# Patient Record
Sex: Female | Born: 1990 | Race: White | Hispanic: No | Marital: Single | Smoking: Never smoker
Health system: Southern US, Community
[De-identification: ages and names within clinical notes are randomized; demographics above are authoritative.]

## PROBLEM LIST (undated history)

## (undated) DIAGNOSIS — J45909 Unspecified asthma, uncomplicated: Secondary | ICD-10-CM

---
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
## 2008-05-06 ENCOUNTER — Ambulatory Visit (HOSPITAL_BASED_OUTPATIENT_CLINIC_OR_DEPARTMENT_OTHER): Admission: RE | Admit: 2008-05-06 | Discharge: 2008-05-06 | Payer: Self-pay | Admitting: Otolaryngology

## 2008-05-06 ENCOUNTER — Encounter (INDEPENDENT_AMBULATORY_CARE_PROVIDER_SITE_OTHER): Payer: Self-pay | Admitting: Otolaryngology

## 2008-05-09 ENCOUNTER — Emergency Department (HOSPITAL_COMMUNITY): Admission: EM | Admit: 2008-05-09 | Discharge: 2008-05-10 | Payer: Self-pay | Admitting: Emergency Medicine

## 2009-06-26 IMAGING — CR DG ABDOMEN ACUTE W/ 1V CHEST
3 series · 3 of 3 positions shown · non-contrast
Comparison: None.

CLINICAL DATA: Vomiting.  Dehydration.  Status post tonsillectomy
on 05/06/2008.

ACUTE ABDOMEN SERIES (ABDOMEN 2 VIEW & CHEST 1 VIEW)

[w chest pa]
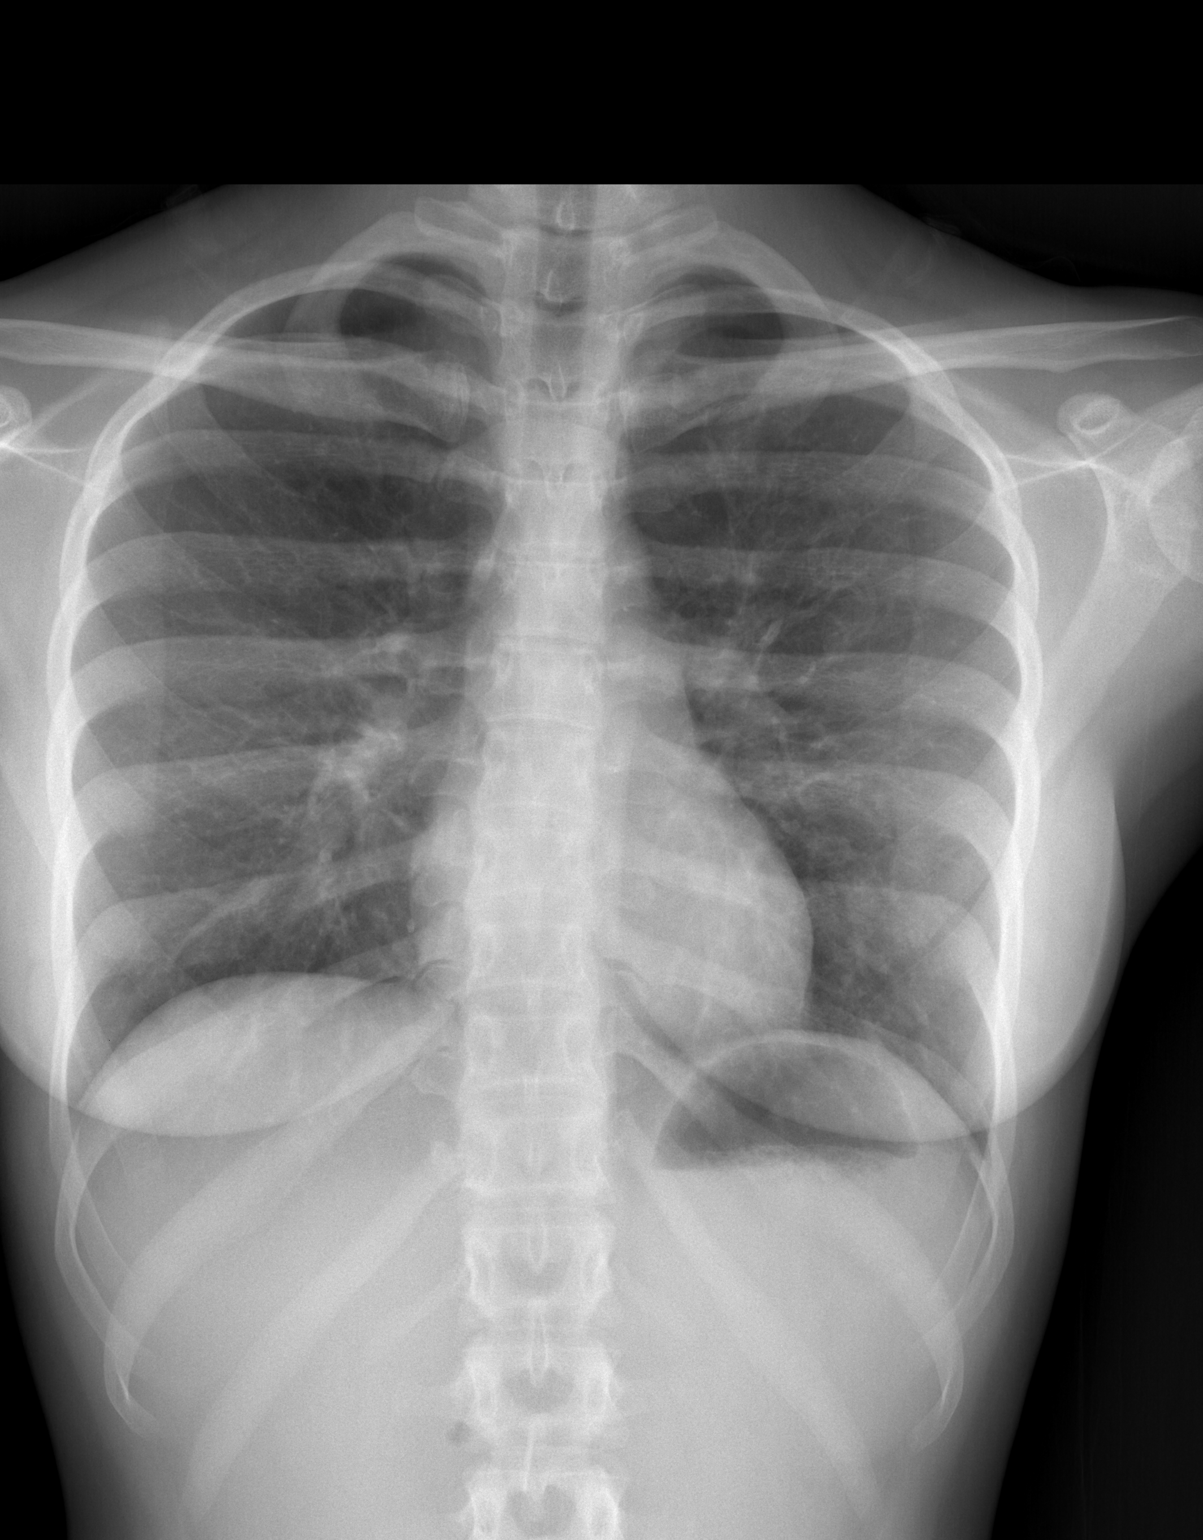

[w abdomen upright *]
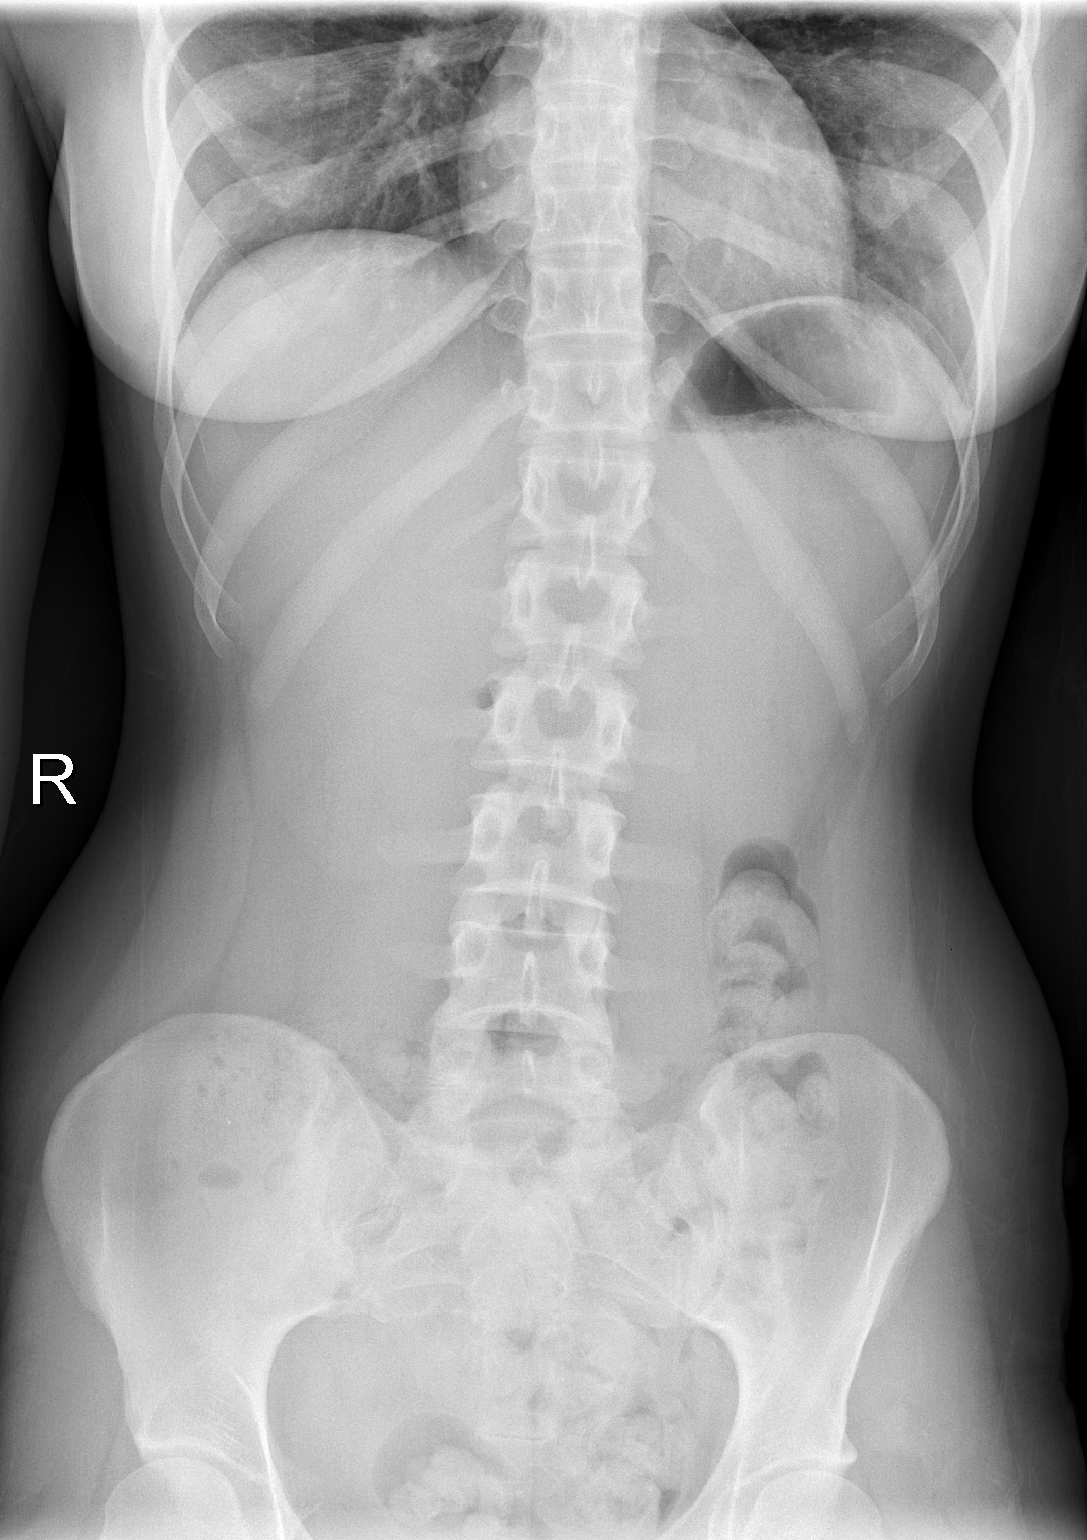

[t abdomen supine]
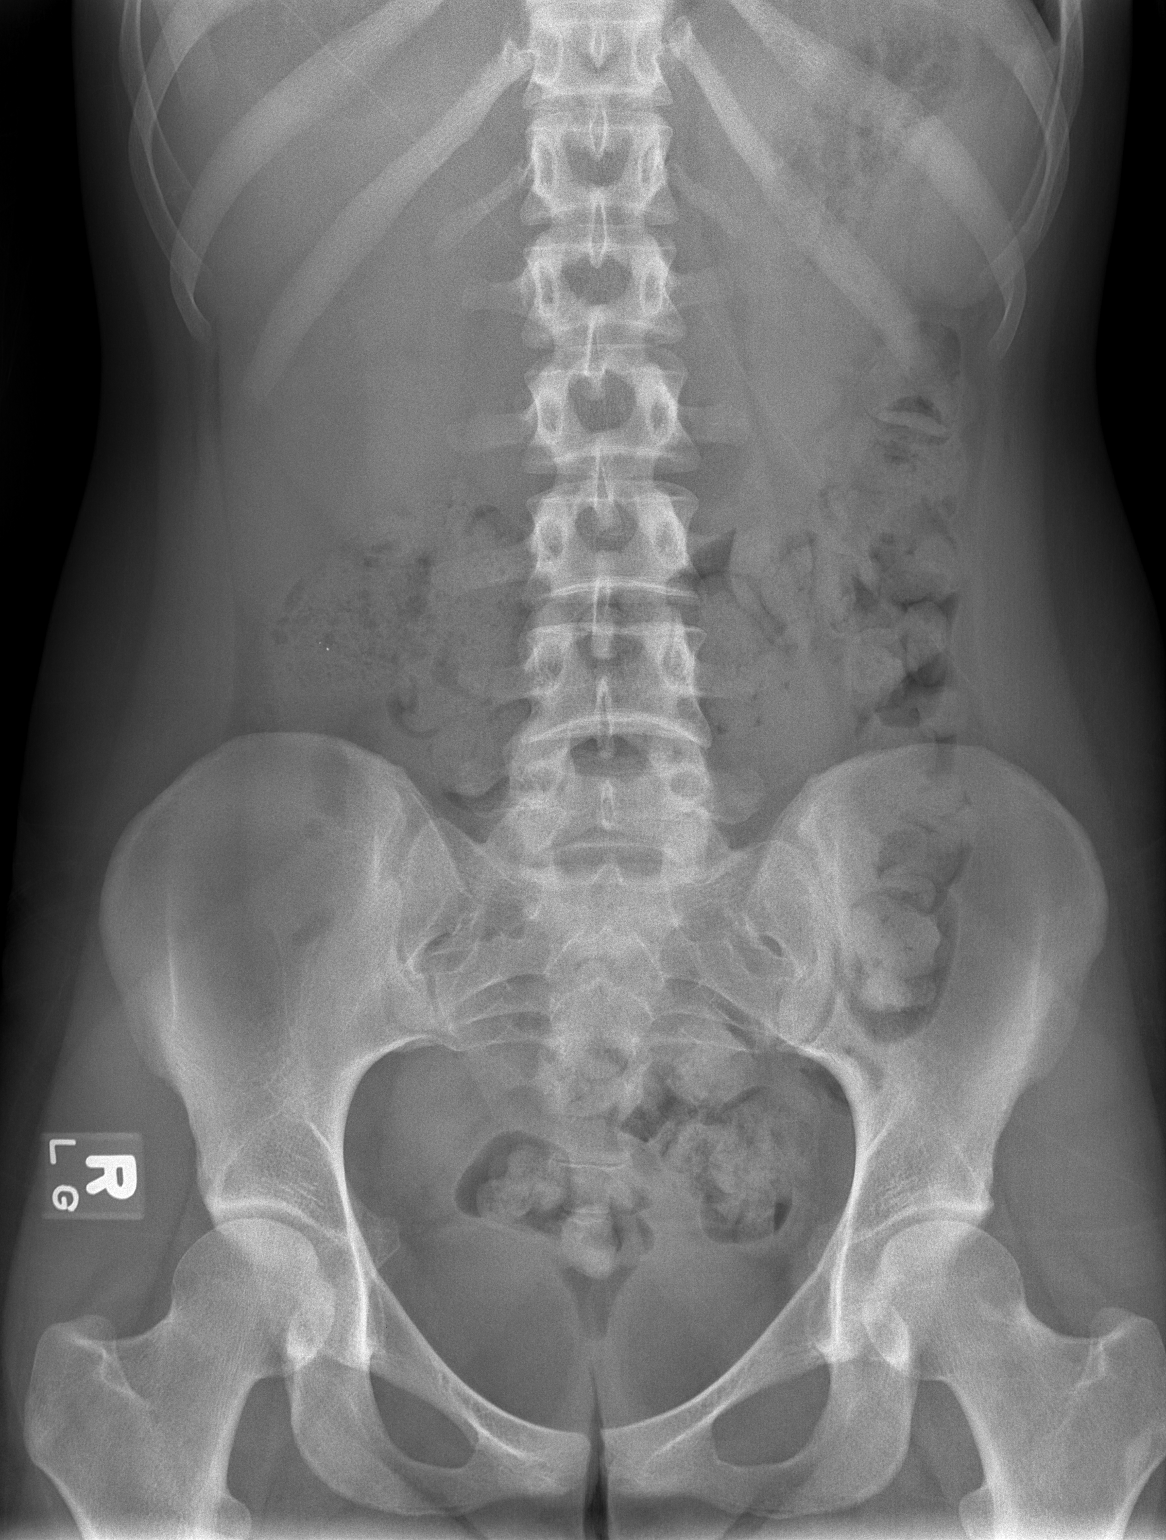

[3 of 3 positions shown; findings below may reference images not displayed]

FINDINGS: Normal sized heart.  Clear lungs.  Normal bowel gas
pattern without free peritoneal air.  Stool throughout the colon.
Mild scoliosis on the upright view.
IMPRESSION: Stool throughout the colon.  No acute abnormality.

## 2010-10-30 NOTE — Op Note (Signed)
NAME:  Sonya Parker, Sonya Parker              ACCOUNT NO.:  [id number]   MEDICAL RECORD NO.:  [id number]          PATIENT TYPE:  AMB   LOCATION:  DSC                          FACILITY:  [facility]   PHYSICIAN:  Christopher E. Ezzard Standing, M.D.DATE OF BIRTH:  Jan 01, 1991   DATE OF PROCEDURE:  05/06/2008  DATE OF DISCHARGE:                               OPERATIVE REPORT   PREOPERATIVE DIAGNOSIS:  Recurrent tonsillitis.   POSTOPERATIVE DIAGNOSIS:  Recurrent tonsillitis.   OPERATION PERFORMED:  Tonsillectomy.   SURGEON:  Kristine Garbe. Ezzard Standing, MD   ANESTHESIA:  General endotracheal.   COMPLICATIONS:  None.   CLINICAL NOTE:  Sonya Parker is an 20 year old female who has had  history of recurrent tonsil problems with recurrent sore throats and  swollen tonsils.  This occurs several times every year.  Because of  history of recurrent tonsillitis, she is taken to the operating room at  this time for tonsillectomy.   DESCRIPTION OF PROCEDURE:  After adequate endotracheal anesthesia, the  patient received 1 g Ancef IV preoperatively as well as 10 mg of  Decadron.  A mouth gag was used to expose the oropharynx.  The left and  right tonsils were then resected from tonsillar fossa using the cautery.  Care was taken to preserve the anterior and posterior tonsillar pillars  as well as uvula.  Hemostasis was obtained with cautery.  Oropharynx was  then irrigated with saline.  This completed procedure.  Sonya Parker was  awoken from anesthesia and transferred to recovery room, postop doing  well.   DISPOSITION:  Sonya Parker is discharged home later this morning on  Zithromax suspension 200 mg daily for 6 days and Tylenol Lortab elixir  one to one-half tablespoons q.4 h p.r.n. pain.  Further followup in my  office in 2 weeks for recheck.           ______________________________  Kristine Garbe. Ezzard Standing, M.D.     CEN/MEDQ  D:  05/06/2008  T:  05/06/2008  Job:  [id number]

## 2011-03-19 LAB — POCT HEMOGLOBIN-HEMACUE: Hemoglobin: 13.4

## 2018-11-26 ENCOUNTER — Other Ambulatory Visit: Payer: Self-pay

## 2018-11-26 ENCOUNTER — Encounter (HOSPITAL_COMMUNITY): Payer: Self-pay | Admitting: Family Medicine

## 2018-11-26 ENCOUNTER — Ambulatory Visit (HOSPITAL_COMMUNITY)
Admission: EM | Admit: 2018-11-26 | Discharge: 2018-11-26 | Disposition: A | Discharge status: Home / Self Care | Payer: Managed Care, Other (non HMO) | Attending: Family Medicine | Admitting: Family Medicine

## 2018-11-26 DIAGNOSIS — R198 Other specified symptoms and signs involving the digestive system and abdomen: Secondary | ICD-10-CM | POA: Diagnosis not present

## 2018-11-26 DIAGNOSIS — K1379 Other lesions of oral mucosa: Secondary | ICD-10-CM

## 2018-11-26 HISTORY — DX: Unspecified asthma, uncomplicated: J45.909

## 2018-11-26 MED ORDER — PREDNISONE 10 MG (21) PO TBPK: ORAL_TABLET | Freq: Every day | ORAL | 0 refills | Status: AC

## 2018-11-26 NOTE — ED Triage Notes (Signed)
Pt states she has sores that come up in the inside of her mouth that comes and goes x 1 week. This has happened 3 times in one week.

## 2018-12-04 NOTE — ED Provider Notes (Signed)
[facility]   [id number] 11/26/18 Arrival Time: 7106  ASSESSMENT & PLAN:  1. Mucosal irritation of oral cavity    No sign of abscess requiring I&D at this time. Discussed. Reassured this does not look like herpes labialis. Question irritative vs early mucocele. Trial of:  Meds ordered this encounter  Medications  . predniSONE (STERAPRED UNI-PAK 21 TAB) 10 MG (21) TBPK tablet    Sig: Take by mouth daily. Take as directed.    Dispense:  21 tablet    Refill:  0    She may schedule dental evaluation as soon as possible if not improving over the next 24-48 hours.  Reviewed expectations re: course of current medical issues. Questions answered. Outlined signs and symptoms indicating need for more acute intervention. Patient verbalized understanding. After Visit Summary given.   SUBJECTIVE:  Sonya Parker is a 28 y.o. female who reports concern over "a few little small areas" over upper lip/inside. On/off over the past week or two. No pain. Does not limit eating/swallowing. No self tx. No specific aggravating or alleviating factors reported. No h/o similar. Worry voiced over possibility of these being herpes. No bleeding or drainage. No injury to lips.  ROS: As per HPI.  OBJECTIVE: Vitals:   11/26/18 1824 11/26/18 1827  BP:  123/80  Pulse:  93  Resp:  18  Temp:  98.6 F (37 C)  TempSrc:  Oral  SpO2:  100%  Weight: 47.2 kg     General appearance: alert; no distress HENT: normocephalic; atraumatic; dentition: good; upper and lower gums without areas of fluctuance, drainage, or bleeding and without tenderness to palpation; normal jaw movement without difficulty; very slight irritated area of upper inner L lip without ulcerations or TTP Neck: supple without LAD; FROM; trachea midline Lungs: normal respirations; unlabored Skin: warm and dry Psychological: alert and cooperative; normal mood and affect  Allergies  Allergen Reactions  . Penicillins     Past  Medical History:  Diagnosis Date  . Asthma    Social History   Socioeconomic History  . Marital status: Single    Spouse name: Not on file  . Number of children: Not on file  . Years of education: Not on file  . Highest education level: Not on file  Occupational History  . Not on file  Social Needs  . Financial resource strain: Not on file  . Food insecurity    Worry: Not on file    Inability: Not on file  . Transportation needs    Medical: Not on file    Non-medical: Not on file  Tobacco Use  . Smoking status: Never Smoker  . Smokeless tobacco: Never Used  Substance and Sexual Activity  . Alcohol use: Yes  . Drug use: Never  . Sexual activity: Yes  Lifestyle  . Physical activity    Days per week: Not on file    Minutes per session: Not on file  . Stress: Not on file  Relationships  . Social Herbalist on phone: Not on file    Gets together: Not on file    Attends religious service: Not on file    Active member of club or organization: Not on file    Attends meetings of clubs or organizations: Not on file    Relationship status: Not on file  . Intimate partner violence    Fear of current or ex partner: Not on file    Emotionally abused: Not on file  Physically abused: Not on file    Forced sexual activity: Not on file  Other Topics Concern  . Not on file  Social History Narrative  . Not on file   Family History  Problem Relation Age of Onset  . Healthy Mother   . Healthy Father    History reviewed. No pertinent surgical history.   Mardella LaymanHagler, Kendelle Schweers, MD 12/04/18 [phone]

## 2024-03-09 ENCOUNTER — Other Ambulatory Visit: Payer: Self-pay

## 2024-03-09 ENCOUNTER — Encounter (HOSPITAL_BASED_OUTPATIENT_CLINIC_OR_DEPARTMENT_OTHER): Payer: Self-pay | Admitting: *Deleted

## 2024-03-09 ENCOUNTER — Emergency Department (HOSPITAL_BASED_OUTPATIENT_CLINIC_OR_DEPARTMENT_OTHER)
Admission: EM | Admit: 2024-03-09 | Discharge: 2024-03-09 | Disposition: A | Discharge status: Home / Self Care | Source: Ambulatory Visit | Attending: Emergency Medicine | Admitting: Emergency Medicine

## 2024-03-09 ENCOUNTER — Emergency Department (HOSPITAL_BASED_OUTPATIENT_CLINIC_OR_DEPARTMENT_OTHER)

## 2024-03-09 DIAGNOSIS — J40 Bronchitis, not specified as acute or chronic: Secondary | ICD-10-CM | POA: Insufficient documentation

## 2024-03-09 DIAGNOSIS — J069 Acute upper respiratory infection, unspecified: Secondary | ICD-10-CM | POA: Insufficient documentation

## 2024-03-09 DIAGNOSIS — D72829 Elevated white blood cell count, unspecified: Secondary | ICD-10-CM | POA: Insufficient documentation

## 2024-03-09 DIAGNOSIS — R059 Cough, unspecified: Secondary | ICD-10-CM | POA: Diagnosis present

## 2024-03-09 LAB — CBC WITH DIFFERENTIAL/PLATELET
Abs Immature Granulocytes: 0.03 K/uL (ref 0.00–0.07)
Basophils Absolute: 0 K/uL (ref 0.0–0.1)
Basophils Relative: 0 %
Eosinophils Absolute: 0.2 K/uL (ref 0.0–0.5)
Eosinophils Relative: 2 %
HCT: 43.6 % (ref 36.0–46.0)
Hemoglobin: 14.6 g/dL (ref 12.0–15.0)
Immature Granulocytes: 0 %
Lymphocytes Relative: 25 %
Lymphs Abs: 3.5 K/uL (ref 0.7–4.0)
MCH: 28.9 pg (ref 26.0–34.0)
MCHC: 33.5 g/dL (ref 30.0–36.0)
MCV: 86.2 fL (ref 80.0–100.0)
Monocytes Absolute: 0.5 K/uL (ref 0.1–1.0)
Monocytes Relative: 3 %
Neutro Abs: 9.6 K/uL — ABNORMAL HIGH (ref 1.7–7.7)
Neutrophils Relative %: 70 %
Platelets: 271 K/uL (ref 150–400)
RBC: 5.06 MIL/uL (ref 3.87–5.11)
RDW: 12.4 % (ref 11.5–15.5)
WBC: 13.8 K/uL — ABNORMAL HIGH (ref 4.0–10.5)
nRBC: 0 % (ref 0.0–0.2)

## 2024-03-09 LAB — COMPREHENSIVE METABOLIC PANEL WITH GFR
ALT: 12 U/L (ref 0–44)
AST: 16 U/L (ref 15–41)
Albumin: 4.6 g/dL (ref 3.5–5.0)
Alkaline Phosphatase: 59 U/L (ref 38–126)
Anion gap: 14 (ref 5–15)
BUN: 20 mg/dL (ref 6–20)
CO2: 22 mmol/L (ref 22–32)
Calcium: 9.5 mg/dL (ref 8.9–10.3)
Chloride: 104 mmol/L (ref 98–111)
Creatinine, Ser: 0.87 mg/dL (ref 0.44–1.00)
GFR, Estimated: >60 mL/min (ref >60)
Glucose, Bld: 111 mg/dL — ABNORMAL HIGH (ref 70–99)
Potassium: 3.8 mmol/L (ref 3.5–5.1)
Sodium: 139 mmol/L (ref 135–145)
Total Bilirubin: 0.6 mg/dL (ref 0.0–1.2)
Total Protein: 6.8 g/dL (ref 6.5–8.1)

## 2024-03-09 LAB — HCG, SERUM, QUALITATIVE: Preg, Serum: NEGATIVE

## 2024-03-09 LAB — TROPONIN T, HIGH SENSITIVITY: Troponin T High Sensitivity: <15 ng/L (ref 0–19)

## 2024-03-09 MED ORDER — IOHEXOL 350 MG/ML SOLN
75.0000 mL | Freq: Once | INTRAVENOUS | Status: AC | PRN
  Administered 2024-03-09: 75 mL via INTRAVENOUS

## 2024-03-09 MED ORDER — METHYLPREDNISOLONE 4 MG PO TBPK: ORAL_TABLET | ORAL | 0 refills | Status: AC

## 2024-03-09 NOTE — ED Provider Notes (Signed)
 [place] EMERGENCY DEPARTMENT AT [facility] Provider Note   CSN: [id number] Arrival date & time: 03/09/24  1723     Patient presents with: Chest Pain   Sonya Parker is a 33 y.o. female.   Patient sent here for PE scan.  She had an elevated D-dimer today outpatient.  She is represcribed doxycycline today.  History of asthma.  Has been treated with steroids and a course of antibiotic but has noticed heart rates been fast at times.  Still with some cough and chest discomfort at times.  Just had a flight back home recently as well.  Denies any weakness numbness tingling.  Denies any fever or chills.  The history is provided by the patient.       Prior to Admission medications   Medication Sig Start Date End Date Taking? Authorizing Provider  methylPREDNISolone  (MEDROL  DOSEPAK) 4 MG TBPK tablet Follow package insert 03/09/24  Yes Alexys Gassett, DO  predniSONE  (STERAPRED UNI-PAK 21 TAB) 10 MG (21) TBPK tablet Take by mouth daily. Take as directed. 11/26/18   Rolinda Rogue, MD    Allergies: Penicillins    Review of Systems  Updated Vital Signs BP 127/65   Pulse 98   Temp 98.1 F (36.7 C) (Oral)   Resp 19   Ht 5' (1.524 m)   Wt 47.6 kg   LMP 02/16/2024 (Approximate)   SpO2 100%   BMI 20.51 kg/m   Physical Exam Vitals and nursing note reviewed.  Constitutional:      General: She is not in acute distress.    Appearance: She is well-developed. She is not ill-appearing.  HENT:     Head: Normocephalic and atraumatic.  Eyes:     Extraocular Movements: Extraocular movements intact.     Conjunctiva/sclera: Conjunctivae normal.     Pupils: Pupils are equal, round, and reactive to light.  Cardiovascular:     Rate and Rhythm: Normal rate and regular rhythm.     Pulses:          Radial pulses are 2+ on the right side and 2+ on the left side.     Heart sounds: Normal heart sounds. No murmur heard. Pulmonary:     Effort: Pulmonary effort is normal. No respiratory  distress.     Breath sounds: Normal breath sounds. No decreased breath sounds or wheezing.  Abdominal:     Palpations: Abdomen is soft.     Tenderness: There is no abdominal tenderness.  Musculoskeletal:        General: No swelling. Normal range of motion.     Cervical back: Normal range of motion and neck supple.     Right lower leg: No edema.     Left lower leg: No edema.  Skin:    General: Skin is warm and dry.     Capillary Refill: Capillary refill takes less than 2 seconds.  Neurological:     Mental Status: She is alert.  Psychiatric:        Mood and Affect: Mood normal.     (all labs ordered are listed, but only abnormal results are displayed) Labs Reviewed  CBC WITH DIFFERENTIAL/PLATELET - Abnormal; Notable for the following components:      Result Value   WBC 13.8 (*)    Neutro Abs 9.6 (*)    All other components within normal limits  COMPREHENSIVE METABOLIC PANEL WITH GFR - Abnormal; Notable for the following components:   Glucose, Bld 111 (*)    All other components  within normal limits  HCG, SERUM, QUALITATIVE  TROPONIN T, HIGH SENSITIVITY    EKG: EKG Interpretation Date/Time:  Tuesday March 09 2024 18:13:14 EDT Ventricular Rate:  98 PR Interval:  124 QRS Duration:  74 QT Interval:  348 QTC Calculation: 444 R Axis:   93  Text Interpretation: Normal sinus rhythm with sinus arrhythmia Rightward axis Borderline ECG No previous ECGs available Confirmed by Ruthe Cornet [phone]) on 03/09/2024 6:32:26 PM  Radiology: CT Angio Chest PE W and/or Wo Contrast Result Date: 03/09/2024 CLINICAL DATA:  Pulmonary embolus suspected with low to intermediate probability. Positive D-dimer. Chest pain. Respiratory illness for 6 weeks. Recent air travel on Sunday. EXAM: CT ANGIOGRAPHY CHEST WITH CONTRAST TECHNIQUE: Multidetector CT imaging of the chest was performed using the standard protocol during bolus administration of intravenous contrast. Multiplanar CT image  reconstructions and MIPs were obtained to evaluate the vascular anatomy. RADIATION DOSE REDUCTION: This exam was performed according to the departmental dose-optimization program which includes automated exposure control, adjustment of the mA and/or kV according to patient size and/or use of iterative reconstruction technique. CONTRAST:  75mL OMNIPAQUE  IOHEXOL  350 MG/ML SOLN COMPARISON:  Chest radiograph 07/29/2023. Abdominal series 05/09/2008 FINDINGS: Cardiovascular: Technically adequate study with good opacification of the central and segmental pulmonary arteries. Moderate motion artifact. No filling defects are demonstrated in the pulmonary arteries. No evidence of significant pulmonary embolus. Normal heart size. No pericardial effusions. Normal caliber thoracic aorta. No aortic dissection. Great vessel origins are patent. Mediastinum/Nodes: No enlarged mediastinal, hilar, or axillary lymph nodes. Thyroid gland, trachea, and esophagus demonstrate no significant findings. Lungs/Pleura: Lungs are clear. No pleural effusion or pneumothorax. Upper Abdomen: No acute abnormality. Musculoskeletal: No chest wall abnormality. No acute or significant osseous findings. Review of the MIP images confirms the above findings. IMPRESSION: 1. No evidence of significant pulmonary embolus. 2. Lungs are clear. Electronically Signed   By: Elsie Gravely M.D.   On: 03/09/2024 19:14     Procedures   Medications Ordered in the ED  iohexol  (OMNIPAQUE ) 350 MG/ML injection 75 mL (75 mLs Intravenous Contrast Given 03/09/24 1851)                                    Medical Decision Making Amount and/or Complexity of Data Reviewed Labs: ordered. Radiology: ordered.  Risk Prescription drug management.   Sonya Parker is here for a PE scan after having a positive D-dimer.  Differential diagnosis likely ongoing bronchitis reactive airway process.  She has been on Z-Pak and steroids which she finished a couple days ago  but still having cough some chest discomfort at times.  Had a D-dimer that was positive outpatient.  CT scan today shows no evidence of PE or other acute process.  Troponin normal.  EKG with sinus rhythm.  No ischemic changes.  Lab work showed mild leukocytosis but no evidence of electrolyte abnormality or AKI or other acute process.  She was started on a course of doxycycline today with her primary care doctor.  Will add a Medrol  Dosepak as well and continue to treat what I suspect is a reactive airway/bronchitis process.  I have no concern for ACS PE other acute process at this time.  Discharged in good condition.  Understands return precautions.  This chart was dictated using voice recognition software.  Despite best efforts to proofread,  errors can occur which can change the documentation meaning.  Final diagnoses:  Viral upper respiratory tract infection  Bronchitis    ED Discharge Orders          Ordered    methylPREDNISolone  (MEDROL  DOSEPAK) 4 MG TBPK tablet        03/09/24 1941               Ruthe Cornet, DO 03/09/24 1944

## 2024-03-09 NOTE — Progress Notes (Signed)
 STAT lab results   I Spoke with patient   She will go to the ER for CT angiogram since her D-Dimer is positive and she is having chest pain with tachycardia following flight to [place] 

## 2024-03-09 NOTE — ED Notes (Signed)
 DC paperwork given and verbally understood.

## 2024-03-09 NOTE — Progress Notes (Signed)
 Assessment and Plan   1. Shortness of breath (Primary) -     D-Dimer, Quantitative; Future -     D-Dimer, Quantitative 2. Chest pain, unspecified type -     D-Dimer, Quantitative; Future 3. Chest pain, unspecified -     D-Dimer, Quantitative    3 weeks of upper respiratory symptoms  Waxing and waning now chest pain and tachycardia after flying to and from [place] this past weekend D-dimer to rule out PE D dimer positive   I spoke with patient   I Sent to ER for CT Angio and EKG for persist ant chest pain and tachycardia   On Doxycyline         Risks, benefits, and alternatives of the medications and treatment plan prescribed today were discussed, and patient expressed understanding. Plan follow-up as discussed or as needed if any worsening symptoms or change in condition.      Subjective   Sonya Parker is a 33 y.o. (DOB 26-Jul-1990) female who presents for the following:    Patient presents with  . URI    Some better still had cough lungs feel like they are being squeezed, chest pain and tachycardia Sunday night     URI  The current episode started 1 to 4 weeks ago. The problem has been waxing and waning. Associated symptoms include chest pain, congestion, coughing, rhinorrhea, sinus pain and wheezing. Pertinent negatives include no abdominal pain, diarrhea, dysuria, ear pain, headaches, joint pain, joint swelling, nausea, neck pain, plugged ear sensation, rash, sneezing, sore throat, swollen glands or vomiting. She has tried acetaminophen for the symptoms. The treatment provided no relief.   Seen last week on 9/11  azithromycin    Covid neg times two   started prednisone  03/01/2024 started to feel better by 9/19   Flew to [place]   Felt good over the Weekend   Flew home Sunday   woke up Monday with chest pain renewed cough   Increase heart rate   here today due to check tightness and tachycardia     Reviewed and updated this visit by provider: Tobacco  Allergies  Meds   Problems  Med Hx  Surg Hx  Fam Hx      Review of Systems  HENT:  Positive for congestion, rhinorrhea and sinus pain. Negative for ear pain, sneezing and sore throat.   Respiratory:  Positive for cough and wheezing.   Cardiovascular:  Positive for chest pain.  Gastrointestinal:  Negative for abdominal pain, diarrhea, nausea and vomiting.  Genitourinary:  Negative for dysuria.  Musculoskeletal:  Negative for joint pain and neck pain.  Skin:  Negative for rash.  Neurological:  Negative for headaches.          Objective   Vitals:   03/09/24 1342  BP: 126/82  Patient Position: Sitting  Pulse: 88  Temp: 97.8 F (36.6 C)  TempSrc: Oral  Resp: 16  Height: 5' (1.524 m)  Weight: 105 lb 3.2 oz (47.7 kg)  SpO2: 99%  BMI (Calculated): 20.5  PainSc:   5  PainLoc: Chest    Physical Exam Vitals and nursing note reviewed.  Constitutional:      General: She is not in acute distress.    Appearance: Normal appearance. She is well-developed. She is not ill-appearing, toxic-appearing or diaphoretic.  HENT:     Head: Normocephalic and atraumatic.     Right Ear: External ear normal.     Left Ear: External ear normal.     Nose: Nose  normal.  Eyes:     Pupils: Pupils are equal, round, and reactive to light.  Neck:     Thyroid: No thyromegaly.     Vascular: No carotid bruit.     Trachea: No tracheal deviation.  Cardiovascular:     Rate and Rhythm: Normal rate and regular rhythm.     Heart sounds: Normal heart sounds. No murmur heard.    No friction rub. No gallop.  Musculoskeletal:     Cervical back: Normal range of motion and neck supple.  Pulmonary:     Effort: Pulmonary effort is normal. No respiratory distress.     Breath sounds: Normal breath sounds. No wheezing.  Abdominal:     General: Bowel sounds are normal. There is no distension.     Palpations: Abdomen is soft. There is no mass.     Tenderness: There is no abdominal tenderness. There is no guarding or rebound.      Hernia: No hernia is present.  Lymphadenopathy:     Cervical: No cervical adenopathy.  Skin:    General: Skin is warm and dry.     Coloration: Skin is not pale.     Findings: No erythema or rash.  Neurological:     Mental Status: She is alert and oriented to person, place, and time.     Gait: Gait normal.     Deep Tendon Reflexes: Reflexes are normal and symmetric.  Psychiatric:        Mood and Affect: Mood normal.        Behavior: Behavior normal.        Thought Content: Thought content normal.        Judgment: Judgment normal.

## 2024-03-09 NOTE — Telephone Encounter (Signed)
 Spoke with patient   She will go to the ER for CT angiogram since her D-Dimer is positive and she is having chest pain with tachycardia following flight to [place] 

## 2024-03-09 NOTE — ED Triage Notes (Signed)
 BIB family from home for CP. Reports respiratory illness over last 6 weeks. Has had 2 courses of ABT and prednisone . Mentions recent flight on Sunday. Seen by PCP today around 1330 and had an elevated d-dimer. Instructed to come to ED. Non smoker. Denies COC/ BC. Rates transietn central and L CP as 6/10. Also endorses some: Shortness of breath; Back pain; Weakness. Alert, NAD, calm, interactive, steady gait.
